# Patient Record
Sex: Male | Born: 1995 | Race: Black or African American | Hispanic: No | Marital: Single | Smoking: Former smoker
Health system: Southern US, Community
[De-identification: ages and names within clinical notes are randomized; demographics above are authoritative.]

---
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
## 2014-07-29 ENCOUNTER — Encounter (INDEPENDENT_AMBULATORY_CARE_PROVIDER_SITE_OTHER): Payer: Self-pay

## 2014-07-29 ENCOUNTER — Ambulatory Visit (INDEPENDENT_AMBULATORY_CARE_PROVIDER_SITE_OTHER): Payer: Medicaid Other | Admitting: Pediatrics

## 2014-07-29 ENCOUNTER — Encounter: Payer: Self-pay | Admitting: Pediatrics

## 2014-07-29 VITALS — BP 122/62 | Temp 98.7°F | Wt 173.6 lb

## 2014-07-29 DIAGNOSIS — Z23 Encounter for immunization: Secondary | ICD-10-CM | POA: Diagnosis not present

## 2014-07-29 DIAGNOSIS — Z1389 Encounter for screening for other disorder: Secondary | ICD-10-CM | POA: Diagnosis not present

## 2014-07-29 DIAGNOSIS — Z7251 High risk heterosexual behavior: Secondary | ICD-10-CM | POA: Diagnosis not present

## 2014-07-29 LAB — POCT URINALYSIS DIPSTICK
BILIRUBIN UA: NEGATIVE
Blood, UA: NEGATIVE
GLUCOSE UA: NEGATIVE
Nitrite, UA: NEGATIVE
Protein, UA: NEGATIVE
SPEC GRAV UA: 1.01
UROBILINOGEN UA: NEGATIVE
pH, UA: 7

## 2014-07-29 NOTE — Patient Instructions (Signed)
We saw you today for urinary complaints, which you did not wind up having=) For the dry skin, you can use Vaseline as needed (avoid hydrocortisone to the penis). We will call you if any lab results are negative. Make sure to schedule a well visit with a doctor in town.

## 2014-07-29 NOTE — Progress Notes (Addendum)
Subjective:    Daniel Reid is a 19 y.o. old male here by himself.    HPI Appointment was made by pt's mother for orange/brown colored urine. Pt denies such complaints. Pt has not noticed any change in color of urine, no blood in urine, no frequency. No discharge. No fevers. Pt does have eczema around his feet, arms and his penis; sometimes bothers him. Puts hydrocortisone on the dry areas, on penis as well. Sexually active with women, last was 1 month ago, using condoms about 50% of the time. Pt did have an STD (does not know what it was) when a condom broke in the beginning of 2015. Pt interested in testing today. 6 partners since last year.  Review of Systems Negative except as per HPI  History and Problem List: Daniel Reid has Sore throat; Sinus pain; and Lymphadenopathy, cervical on his problem list.  Daniel Reid  has no past medical history on file.  Immunizations needed: due for vaccines but not available due to age     Objective:    BP 122/62 mmHg  Temp(Src) 98.7 F (37.1 C) (Temporal)  Wt 173 lb 9.6 oz (78.744 kg) Physical Exam  Physical Examination: General appearance - alert, well appearing, and in no distress Mental status - alert, oriented to person, place, and time Eyes - pupils equal and reactive, extraocular eye movements intact Nose - normal and patent, no erythema, discharge or polyps Mouth - mucous membranes moist, pharynx normal without lesions Neck - supple, no significant adenopathy Chest - clear to auscultation, no wheezes, rales or rhonchi, symmetric air entry Heart - normal rate, regular rhythm, normal S1, S2, no murmurs, rubs, clicks or gallops Abdomen - soft, nontender, nondistended, no masses or organomegaly Neurological -  alert, oriented, no focal findings. Normal CN II-XII Musculoskeletal - normal strength and tone for age Extremities - peripheral pulses normal, no pedal edema, no clubbing or cyanosis Skin - normal coloration and turgor, no rashes,  no skin lesions noted Tanner Stage: 5, dry skin on ventral are of shaft of penis. No other lesions  Results for orders placed or performed in visit on 07/29/14 (from the past 24 hour(s))  POCT urinalysis dipstick   Collection Time: 07/29/14  3:34 PM  Result Value Ref Range   Color, UA yellow    Clarity, UA cloudy    Glucose, UA neg    Bilirubin, UA neg    Ketones, UA small    Spec Grav, UA 1.010    Blood, UA neg    pH, UA 7.0    Protein, UA neg    Urobilinogen, UA negative    Nitrite, UA neg    Leukocytes, UA Trace        Assessment and Plan:     Daniel Reid was seen today for report of urinary changes - UA normal no signs of infection or blood/myoglobin  Pt was also seen today for screening for sexual activity - GC/chlamydia/HIV/RPR sent. Previous history of STD 1 year ago, treated. Pt has been sexually active since with 50% condom use. No urinary complaints today and basically normal UA.   Problem List Items Addressed This Visit    None    Visit Diagnoses    Screening for genitourinary condition    -  Primary    Relevant Orders    POCT urinalysis dipstick (Completed)    RPR    HIV antibody    GC/chlamydia probe amp, urine    Need for vaccination  Relevant Orders    Meningococcal conjugate vaccine 4-valent IM    Varicella vaccine subcutaneous    Sexually active child        Relevant Orders    RPR    HIV antibody    GC/chlamydia probe amp, urine     - Will call pt if results of STD testing are positive at [phone]; otherwise can review at next well visit - Emphasized importance of 100% condom use to prevent STDs, and importance that all partners get treated if positive - For eczema, recommended using Vaseline or lotion; avoid hydrocortisone use on the penis  We also discussed transition to adult care. Daniel Reid was going to identify [employer] in [place] that might work for him  Return if symptoms worsen or fail to improve.  Birder Robson, MD      I saw and evaluated the patient, performing the key elements of the service. I developed the management plan that is described in the resident's note, and I agree with the content.   [facility]                  07/30/2014, 10:50 AM

## 2014-07-30 ENCOUNTER — Encounter: Payer: Self-pay | Admitting: Pediatrics

## 2014-07-30 ENCOUNTER — Telehealth: Payer: Self-pay | Admitting: Pediatrics

## 2014-07-30 NOTE — Telephone Encounter (Signed)
Attempted to call pt x 1 to inform him of normal screening STD labs (negative HIV, RPR, GC and Chlamydia). No answer and did not leave a message.  June LeapPeyton Labrittany Wechter MD PGY3 Pediatrics Pager [phone]

## 2015-02-24 ENCOUNTER — Ambulatory Visit: Payer: Medicaid Other | Admitting: Pediatrics

## 2019-08-12 ENCOUNTER — Encounter (HOSPITAL_COMMUNITY): Payer: Self-pay

## 2019-08-12 ENCOUNTER — Emergency Department (HOSPITAL_COMMUNITY)
Admission: EM | Admit: 2019-08-12 | Discharge: 2019-08-13 | Disposition: A | Discharge status: Home / Self Care | Payer: 59 | Attending: Emergency Medicine | Admitting: Emergency Medicine

## 2019-08-12 ENCOUNTER — Other Ambulatory Visit: Payer: Self-pay

## 2019-08-12 DIAGNOSIS — B349 Viral infection, unspecified: Secondary | ICD-10-CM | POA: Diagnosis not present

## 2019-08-12 DIAGNOSIS — J029 Acute pharyngitis, unspecified: Secondary | ICD-10-CM | POA: Diagnosis present

## 2019-08-12 DIAGNOSIS — Z87891 Personal history of nicotine dependence: Secondary | ICD-10-CM | POA: Diagnosis not present

## 2019-08-12 DIAGNOSIS — Z20822 Contact with and (suspected) exposure to covid-19: Secondary | ICD-10-CM | POA: Insufficient documentation

## 2019-08-12 LAB — GROUP A STREP BY PCR: Group A Strep by PCR: NOT DETECTED

## 2019-08-12 LAB — POC SARS CORONAVIRUS 2 AG -  ED: SARS Coronavirus 2 Ag: NEGATIVE

## 2019-08-12 MED ORDER — SODIUM CHLORIDE 0.9 % IV BOLUS
1000.0000 mL | Freq: Once | INTRAVENOUS | Status: AC
  Administered 2019-08-12: 1000 mL via INTRAVENOUS

## 2019-08-12 MED ORDER — ONDANSETRON HCL 4 MG/2ML IJ SOLN
4.0000 mg | Freq: Once | INTRAMUSCULAR | Status: AC
  Administered 2019-08-12: 23:00:00 4 mg via INTRAVENOUS
  Filled 2019-08-12: qty 2

## 2019-08-12 NOTE — ED Triage Notes (Addendum)
Pt presents to ED with complaints of sore throat, fever up to 100.3, chills, no appetite, and nausea started today.

## 2019-08-12 NOTE — ED Provider Notes (Signed)
[facility] EMERGENCY DEPARTMENT Provider Note   CSN: [id number] Arrival date & time: 08/12/19  2115     History Chief Complaint  Patient presents with  . Sore Throat    Daniel Reid is a 24 y.o. male with flulike symptoms including moderate sore throat, nasal congestion with rhinorrhea, fever of 100.3 max, body aches, reduced appetite, nonproductive cough without chest pain or shortness of breath along with nausea and multiple episodes of vomiting, stating he has been unable to keep any food or fluids down since yesterday evening.  He reports generalized fatigue.  He took a Phenergan about 5 PM today which did relieve the vomiting but he does still maintain nausea.  He is unsure if he has had any Covid exposures.  The history is provided by the patient.       History reviewed. No pertinent past medical history.  Patient Active Problem List   Diagnosis Date Noted  . Sore throat 01/16/2013  . Sinus pain 01/16/2013  . Lymphadenopathy, cervical 01/16/2013    History reviewed. No pertinent surgical history.     No family history on file.  Social History   Tobacco Use  . Smoking status: Former Games developer  . Smokeless tobacco: Never Used  . Tobacco comment: and passive exposure now.   Substance Use Topics  . Alcohol use: Not Currently    Alcohol/week: 0.0 standard drinks  . Drug use: No    Home Medications Prior to Admission medications   Medication Sig Start Date End Date Taking? Authorizing Provider  ondansetron (ZOFRAN ODT) 4 MG disintegrating tablet Take 1 tablet (4 mg total) by mouth every 8 (eight) hours as needed for nausea or vomiting. 08/13/19   Burgess Amor, PA-C    Allergies    Patient has no known allergies.  Review of Systems   Review of Systems  Constitutional: Positive for fatigue and fever.  HENT: Positive for congestion.   Eyes: Negative.   Respiratory: Positive for cough. Negative for shortness of breath.   Cardiovascular: Negative for chest  pain.  Gastrointestinal: Positive for nausea and vomiting. Negative for abdominal pain and diarrhea.  Endocrine: Negative.   Musculoskeletal: Positive for myalgias.  Neurological: Negative.     Physical Exam Updated Vital Signs BP 104/86 (BP Location: Right Arm)   Pulse 92   Temp 99.1 F (37.3 C) (Oral)   Resp 18   Ht 6\' 1"  (1.854 m)   Wt 90.7 kg   SpO2 100%   BMI 26.39 kg/m   Physical Exam Vitals and nursing note reviewed.  Constitutional:      Appearance: He is well-developed.  HENT:     Head: Normocephalic and atraumatic.     Mouth/Throat:     Mouth: Mucous membranes are moist.     Pharynx: Posterior oropharyngeal erythema present. No oropharyngeal exudate or uvula swelling.     Tonsils: 2+ on the right. 2+ on the left.     Comments: Moderately hypertrophied palatine tonsils which are symmetric. Eyes:     Conjunctiva/sclera: Conjunctivae normal.  Cardiovascular:     Rate and Rhythm: Normal rate and regular rhythm.     Heart sounds: Normal heart sounds.  Pulmonary:     Effort: Pulmonary effort is normal. No respiratory distress.     Breath sounds: Normal breath sounds. No wheezing or rhonchi.  Abdominal:     General: Bowel sounds are normal. There is no distension.     Palpations: Abdomen is soft.     Tenderness: There is  no abdominal tenderness. There is no guarding.  Musculoskeletal:        General: Normal range of motion.     Cervical back: Normal range of motion.  Lymphadenopathy:     Cervical: No cervical adenopathy.  Skin:    General: Skin is warm and dry.  Neurological:     General: No focal deficit present.     Mental Status: He is alert.     ED Results / Procedures / Treatments   Labs (all labs ordered are listed, but only abnormal results are displayed) Labs Reviewed  CBC WITH DIFFERENTIAL/PLATELET - Abnormal; Notable for the following components:      Result Value   WBC 13.3 (*)    Neutro Abs 11.2 (*)    Monocytes Absolute 1.1 (*)    All  other components within normal limits  BASIC METABOLIC PANEL - Abnormal; Notable for the following components:   Potassium 3.3 (*)    Glucose, Bld 101 (*)    Calcium 8.1 (*)    All other components within normal limits  GROUP A STREP BY PCR  RESPIRATORY PANEL BY RT PCR (FLU A&B, COVID)  POC SARS CORONAVIRUS 2 AG -  ED    EKG None  Radiology No results found.  Procedures Procedures (including critical care time)  Medications Ordered in ED Medications  ondansetron (ZOFRAN) injection 4 mg (4 mg Intravenous Given 08/12/19 2310)  sodium chloride 0.9 % bolus 1,000 mL (0 mLs Intravenous Stopped 08/13/19 0029)    ED Course  I have reviewed the triage vital signs and the nursing notes.  Pertinent labs & imaging results that were available during my care of the patient were reviewed by me and considered in my medical decision making (see chart for details).    MDM Rules/Calculators/A&P                      Patient with flulike symptoms.  Labs reviewed and are reassuring.  His strep test is negative.  His rapid Covid test is also negative, confirmatory 24-hour test has been collected and sent.  Discussed home treatment for his symptoms including rest, fluids, Tylenol or Motrin for body aches.  Zofran given if nausea or vomiting returns.  He did tolerate p.o. challenge prior to discharge home.  Discussed with patient that he will need to maintain home quarantine until his confirmatory Covid test has resulted and is negative.  If positive, 10-day quarantine per [facility] recommendations.  Return precautions were outlined.  [name] was evaluated in Emergency Department on 08/13/2019 for the symptoms described in the history of present illness. He was evaluated in the context of the global COVID-19 pandemic, which necessitated consideration that the patient might be at risk for infection with the SARS-CoV-2 virus that causes COVID-19. Institutional protocols and algorithms that pertain to the  evaluation of patients at risk for COVID-19 are in a state of rapid change based on information released by regulatory bodies including the CDC and federal and state organizations. These policies and algorithms were followed during the patient's care in the ED.  Final Clinical Impression(s) / ED Diagnoses Final diagnoses:  Viral syndrome    Rx / DC Orders ED Discharge Orders         Ordered    ondansetron (ZOFRAN ODT) 4 MG disintegrating tablet  Every 8 hours PRN     08/13/19 0106           Burgess Amor, PA-C 08/13/19 0115  Noemi Chapel, MD 08/13/19 [phone]

## 2019-08-13 LAB — RESPIRATORY PANEL BY RT PCR (FLU A&B, COVID)
Influenza A by PCR: NEGATIVE
Influenza B by PCR: NEGATIVE
SARS Coronavirus 2 by RT PCR: NEGATIVE

## 2019-08-13 LAB — BASIC METABOLIC PANEL
Anion gap: 9 (ref 5–15)
BUN: 9 mg/dL (ref 6–20)
CO2: 25 mmol/L (ref 22–32)
Calcium: 8.1 mg/dL — ABNORMAL LOW (ref 8.9–10.3)
Chloride: 103 mmol/L (ref 98–111)
Creatinine, Ser: 0.85 mg/dL (ref 0.61–1.24)
GFR calc Af Amer: >60 mL/min (ref >60)
GFR calc non Af Amer: >60 mL/min (ref >60)
Glucose, Bld: 101 mg/dL — ABNORMAL HIGH (ref 70–99)
Potassium: 3.3 mmol/L — ABNORMAL LOW (ref 3.5–5.1)
Sodium: 137 mmol/L (ref 135–145)

## 2019-08-13 LAB — CBC WITH DIFFERENTIAL/PLATELET
Abs Immature Granulocytes: 0.04 10*3/uL (ref 0.00–0.07)
Basophils Absolute: 0 10*3/uL (ref 0.0–0.1)
Basophils Relative: 0 %
Eosinophils Absolute: 0 10*3/uL (ref 0.0–0.5)
Eosinophils Relative: 0 %
HCT: 41.5 % (ref 39.0–52.0)
Hemoglobin: 13.4 g/dL (ref 13.0–17.0)
Immature Granulocytes: 0 %
Lymphocytes Relative: 7 %
Lymphs Abs: 1 10*3/uL (ref 0.7–4.0)
MCH: 31.5 pg (ref 26.0–34.0)
MCHC: 32.3 g/dL (ref 30.0–36.0)
MCV: 97.4 fL (ref 80.0–100.0)
Monocytes Absolute: 1.1 10*3/uL — ABNORMAL HIGH (ref 0.1–1.0)
Monocytes Relative: 8 %
Neutro Abs: 11.2 10*3/uL — ABNORMAL HIGH (ref 1.7–7.7)
Neutrophils Relative %: 85 %
Platelets: 212 10*3/uL (ref 150–400)
RBC: 4.26 MIL/uL (ref 4.22–5.81)
RDW: 11.7 % (ref 11.5–15.5)
WBC: 13.3 10*3/uL — ABNORMAL HIGH (ref 4.0–10.5)
nRBC: 0 % (ref 0.0–0.2)

## 2019-08-13 MED ORDER — ONDANSETRON 4 MG PO TBDP: 4.0000 mg | ORAL_TABLET | Freq: Three times a day (TID) | ORAL | 0 refills | Status: AC | PRN

## 2019-08-13 NOTE — Discharge Instructions (Signed)
Your lab tests tonight are normal including a negative rapid Covid test, although as discussed your send out test is outstanding but should be resulted by tomorrow.  You should stay home and maintain home quarantine until you have the results of this test and it is negative.  If it is positive you will need to stay home for 10 days, longer if your symptoms are still present.  Rest and make sure you are drinking plenty of fluids, you may take Tylenol or Motrin for any fever, chills or body ache.  This should also help sore throat pain as well.  Your strep test is negative.  You may continue using the Zofran which has been prescribed if needed for continued nausea or vomiting.     Person Under Monitoring Name: Daniel Reid  Location: [address] [place] [address]   Infection Prevention Recommendations for Individuals Confirmed to have, or Being Evaluated for, 2019 Novel Coronavirus (COVID-19) Infection Who Receive Care at Home  Individuals who are confirmed to have, or are being evaluated for, COVID-19 should follow the prevention steps below until a healthcare provider or local or state health department says they can return to normal activities.  Stay home except to get medical care You should restrict activities outside your home, except for getting medical care. Do not go to work, school, or public areas, and do not use public transportation or taxis.  Call ahead before visiting your doctor Before your medical appointment, call the healthcare provider and tell them that you have, or are being evaluated for, COVID-19 infection. This will help the healthcare providers office take steps to keep other people from getting infected. Ask your healthcare provider to call the local or state health department.  Monitor your symptoms Seek prompt medical attention if your illness is worsening (e.g., difficulty breathing). Before going to your medical appointment, call the healthcare  provider and tell them that you have, or are being evaluated for, COVID-19 infection. Ask your healthcare provider to call the local or state health department.  Wear a facemask You should wear a facemask that covers your nose and mouth when you are in the same room with other people and when you visit a healthcare provider. People who live with or visit you should also wear a facemask while they are in the same room with you.  Separate yourself from other people in your home As much as possible, you should stay in a different room from other people in your home. Also, you should use a separate bathroom, if available.  Avoid sharing household items You should not share dishes, drinking glasses, cups, eating utensils, towels, bedding, or other items with other people in your home. After using these items, you should wash them thoroughly with soap and water.  Cover your coughs and sneezes Cover your mouth and nose with a tissue when you cough or sneeze, or you can cough or sneeze into your sleeve. Throw used tissues in a lined trash can, and immediately wash your hands with soap and water for at least 20 seconds or use an alcohol-based hand rub.  Wash your [facility] your hands often and thoroughly with soap and water for at least 20 seconds. You can use an alcohol-based hand sanitizer if soap and water are not available and if your hands are not visibly dirty. Avoid touching your eyes, nose, and mouth with unwashed hands.   Prevention Steps for Caregivers and Household Members of Individuals Confirmed to have, or Being  Evaluated for, COVID-19 Infection Being Cared for in the Home  If you live with, or provide care at home for, a person confirmed to have, or being evaluated for, COVID-19 infection please follow these guidelines to prevent infection:  Follow healthcare providers instructions Make sure that you understand and can help the patient follow any healthcare provider  instructions for all care.  Provide for the patients basic needs You should help the patient with basic needs in the home and provide support for getting groceries, prescriptions, and other personal needs.  Monitor the patients symptoms If they are getting sicker, call his or her medical provider and tell them that the patient has, or is being evaluated for, COVID-19 infection. This will help the healthcare providers office take steps to keep other people from getting infected. Ask the healthcare provider to call the local or state health department.  Limit the number of people who have contact with the patient If possible, have only one caregiver for the patient. Other household members should stay in another home or place of residence. If this is not possible, they should stay in another room, or be separated from the patient as much as possible. Use a separate bathroom, if available. Restrict visitors who do not have an essential need to be in the home.  Keep older adults, very young children, and other sick people away from the patient Keep older adults, very young children, and those who have compromised immune systems or chronic health conditions away from the patient. This includes people with chronic heart, lung, or kidney conditions, diabetes, and cancer.  Ensure good ventilation Make sure that shared spaces in the home have good air flow, such as from an air conditioner or an opened window, weather permitting.  Wash your hands often Wash your hands often and thoroughly with soap and water for at least 20 seconds. You can use an alcohol based hand sanitizer if soap and water are not available and if your hands are not visibly dirty. Avoid touching your eyes, nose, and mouth with unwashed hands. Use disposable paper towels to dry your hands. If not available, use dedicated cloth towels and replace them when they become wet.  Wear a facemask and gloves Wear a disposable  facemask at all times in the room and gloves when you touch or have contact with the patients blood, body fluids, and/or secretions or excretions, such as sweat, saliva, sputum, nasal mucus, vomit, urine, or feces.  Ensure the mask fits over your nose and mouth tightly, and do not touch it during use. Throw out disposable facemasks and gloves after using them. Do not reuse. Wash your hands immediately after removing your facemask and gloves. If your personal clothing becomes contaminated, carefully remove clothing and launder. Wash your hands after handling contaminated clothing. Place all used disposable facemasks, gloves, and other waste in a lined container before disposing them with other household waste. Remove gloves and wash your hands immediately after handling these items.  Do not share dishes, glasses, or other household items with the patient Avoid sharing household items. You should not share dishes, drinking glasses, cups, eating utensils, towels, bedding, or other items with a patient who is confirmed to have, or being evaluated for, COVID-19 infection. After the person uses these items, you should wash them thoroughly with soap and water.  Wash laundry thoroughly Immediately remove and wash clothes or bedding that have blood, body fluids, and/or secretions or excretions, such as sweat, saliva, sputum, nasal mucus, vomit,  urine, or feces, on them. Wear gloves when handling laundry from the patient. Read and follow directions on labels of laundry or clothing items and detergent. In general, wash and dry with the warmest temperatures recommended on the label.  Clean all areas the individual has used often Clean all touchable surfaces, such as counters, tabletops, doorknobs, bathroom fixtures, toilets, phones, keyboards, tablets, and bedside tables, every day. Also, clean any surfaces that may have blood, body fluids, and/or secretions or excretions on them. Wear gloves when cleaning  surfaces the patient has come in contact with. Use a diluted bleach solution (e.g., dilute bleach with 1 part bleach and 10 parts water) or a household disinfectant with a label that says EPA-registered for coronaviruses. To make a bleach solution at home, add 1 tablespoon of bleach to 1 quart (4 cups) of water. For a larger supply, add  cup of bleach to 1 gallon (16 cups) of water. Read labels of cleaning products and follow recommendations provided on product labels. Labels contain instructions for safe and effective use of the cleaning product including precautions you should take when applying the product, such as wearing gloves or eye protection and making sure you have good ventilation during use of the product. Remove gloves and wash hands immediately after cleaning.  Monitor yourself for signs and symptoms of illness Caregivers and household members are considered close contacts, should monitor their health, and will be asked to limit movement outside of the home to the extent possible. Follow the monitoring steps for close contacts listed on the symptom monitoring form.   ? If you have additional questions, contact your local health department or call the epidemiologist on call at [phone] (available 24/7). ? This guidance is subject to change. For the most up-to-date guidance from [facility], please refer to their website: [email or web address]
